# Patient Record
Sex: Male | Born: 2005
Health system: Southern US, Community
[De-identification: ages and names within clinical notes are randomized; demographics above are authoritative.]

## PROBLEM LIST (undated history)

## (undated) ENCOUNTER — Ambulatory Visit: Discharge status: Absent without leave | Payer: 59

## (undated) DIAGNOSIS — N050 Unspecified nephritic syndrome with minor glomerular abnormality: Secondary | ICD-10-CM

## (undated) HISTORY — DX: Unspecified nephritic syndrome with minor glomerular abnormality: N05.0

---
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
## 2024-01-28 DIAGNOSIS — N049 Nephrotic syndrome with unspecified morphologic changes: Secondary | ICD-10-CM | POA: Diagnosis not present

## 2024-01-28 DIAGNOSIS — N050 Unspecified nephritic syndrome with minor glomerular abnormality: Secondary | ICD-10-CM | POA: Diagnosis not present

## 2024-01-28 DIAGNOSIS — D2272 Melanocytic nevi of left lower limb, including hip: Secondary | ICD-10-CM | POA: Diagnosis not present

## 2024-01-28 DIAGNOSIS — Z0000 Encounter for general adult medical examination without abnormal findings: Secondary | ICD-10-CM | POA: Diagnosis not present

## 2024-01-28 DIAGNOSIS — Z68.41 Body mass index (BMI) pediatric, 5th percentile to less than 85th percentile for age: Secondary | ICD-10-CM | POA: Diagnosis not present

## 2024-01-28 DIAGNOSIS — D224 Melanocytic nevi of scalp and neck: Secondary | ICD-10-CM | POA: Diagnosis not present

## 2024-01-28 DIAGNOSIS — Z713 Dietary counseling and surveillance: Secondary | ICD-10-CM | POA: Diagnosis not present

## 2024-02-19 ENCOUNTER — Other Ambulatory Visit (HOSPITAL_BASED_OUTPATIENT_CLINIC_OR_DEPARTMENT_OTHER): Payer: Self-pay

## 2024-02-19 MED ORDER — KETOROLAC TROMETHAMINE 10 MG PO TABS
10.0000 mg | ORAL_TABLET | Freq: Four times a day (QID) | ORAL | 0 refills | Status: AC
  Filled 2024-02-19: qty 16, 4d supply, fill #0

## 2024-02-19 MED ORDER — HYDROCODONE-ACETAMINOPHEN 7.5-325 MG PO TABS
0.5000 | ORAL_TABLET | ORAL | 0 refills | Status: AC | PRN
  Filled 2024-02-19: qty 6, 1d supply, fill #0
